# Patient Record
Sex: Female | Born: 1960 | Race: White | Hispanic: No | Marital: Single | Smoking: Never smoker
Health system: Southern US, Community
[De-identification: ages and names within clinical notes are randomized; demographics above are authoritative.]

## PROBLEM LIST (undated history)

## (undated) DIAGNOSIS — A630 Anogenital (venereal) warts: Secondary | ICD-10-CM

## (undated) DIAGNOSIS — B009 Herpesviral infection, unspecified: Secondary | ICD-10-CM

## (undated) DIAGNOSIS — K219 Gastro-esophageal reflux disease without esophagitis: Secondary | ICD-10-CM

## (undated) DIAGNOSIS — C801 Malignant (primary) neoplasm, unspecified: Secondary | ICD-10-CM

## (undated) DIAGNOSIS — D649 Anemia, unspecified: Secondary | ICD-10-CM

## (undated) DIAGNOSIS — S39012A Strain of muscle, fascia and tendon of lower back, initial encounter: Secondary | ICD-10-CM

## (undated) DIAGNOSIS — F509 Eating disorder, unspecified: Secondary | ICD-10-CM

## (undated) DIAGNOSIS — T7840XA Allergy, unspecified, initial encounter: Secondary | ICD-10-CM

## (undated) HISTORY — PX: CYSTECTOMY: SUR359

## (undated) HISTORY — DX: Herpesviral infection, unspecified: B00.9

## (undated) HISTORY — DX: Allergy, unspecified, initial encounter: T78.40XA

## (undated) HISTORY — DX: Anogenital (venereal) warts: A63.0

## (undated) HISTORY — DX: Anemia, unspecified: D64.9

## (undated) HISTORY — DX: Gastro-esophageal reflux disease without esophagitis: K21.9

## (undated) HISTORY — DX: Eating disorder, unspecified: F50.9

## (undated) HISTORY — DX: Strain of muscle, fascia and tendon of lower back, initial encounter: S39.012A

## (undated) HISTORY — DX: Malignant (primary) neoplasm, unspecified: C80.1

---
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
## 2008-06-30 ENCOUNTER — Ambulatory Visit: Payer: Self-pay | Admitting: Gastroenterology

## 2012-01-03 LAB — HM MAMMOGRAPHY: HM MAMMO: NORMAL

## 2013-06-23 LAB — HM PAP SMEAR: HM PAP: NORMAL

## 2014-06-04 DIAGNOSIS — S39012A Strain of muscle, fascia and tendon of lower back, initial encounter: Secondary | ICD-10-CM

## 2014-06-04 HISTORY — DX: Strain of muscle, fascia and tendon of lower back, initial encounter: S39.012A

## 2015-06-24 ENCOUNTER — Ambulatory Visit (INDEPENDENT_AMBULATORY_CARE_PROVIDER_SITE_OTHER): Payer: BC Managed Care – PPO | Admitting: Family Medicine

## 2015-06-24 ENCOUNTER — Encounter: Payer: Self-pay | Admitting: Family Medicine

## 2015-06-24 VITALS — BP 104/72 | HR 61 | Temp 98.6°F | Resp 16 | Ht 65.5 in | Wt 131.6 lb

## 2015-06-24 DIAGNOSIS — K219 Gastro-esophageal reflux disease without esophagitis: Secondary | ICD-10-CM

## 2015-06-24 DIAGNOSIS — Z1239 Encounter for other screening for malignant neoplasm of breast: Secondary | ICD-10-CM

## 2015-06-24 DIAGNOSIS — J302 Other seasonal allergic rhinitis: Secondary | ICD-10-CM | POA: Diagnosis not present

## 2015-06-24 DIAGNOSIS — K625 Hemorrhage of anus and rectum: Secondary | ICD-10-CM | POA: Diagnosis not present

## 2015-06-24 DIAGNOSIS — E785 Hyperlipidemia, unspecified: Secondary | ICD-10-CM

## 2015-06-24 MED ORDER — FLUTICASONE PROPIONATE 50 MCG/ACT NA SUSP: 2.0000 | Freq: Every day | NASAL | Status: AC

## 2015-06-24 MED ORDER — LORATADINE 10 MG PO TABS: 10.0000 mg | ORAL_TABLET | Freq: Every day | ORAL | Status: AC

## 2015-06-24 MED ORDER — OMEPRAZOLE 20 MG PO CPDR: 20.0000 mg | DELAYED_RELEASE_CAPSULE | Freq: Every day | ORAL | Status: AC

## 2015-06-24 NOTE — Assessment & Plan Note (Addendum)
Trial of PPI for voice hoarseness. Previous cough and SOB symptoms could be bronchospasm from GERD.  Check CMET, CBC RTC 1 mos.

## 2015-06-24 NOTE — Assessment & Plan Note (Signed)
Treat for allergic rhinitis. Flonase and claritin daily.  Consider ENT or allergy referral for testing. 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 Awaiting results.

## 2015-06-24 NOTE — Patient Instructions (Signed)
Let's try a medication for acid reflux and medications for allergies to help with your symptoms. Try flonase once daily to help with allergy symptoms. Also try claritin. We will try an omeprazole daily to see if acid reflux is causing your hoarseness.   We will also check some labs.

## 2015-06-24 NOTE — Progress Notes (Signed)
Subjective:    Patient ID: Jeanette Tanner, female    DOB: 12-04-1960, 55 y.o.   MRN: [id number]  HPI: Daarina Thigpen is a 55 y.o. female presenting on 06/24/2015 for Establish Care   HPI  Pt presents to establish care today. Previous care provider was Jefm Bryant- [facility]  It has been 1  years since Her last PCP visit. Records from previous provider will be requested and reviewed. Current medical problems include:  Genital herpes: Takes acyclovir as needed. OUtbreaks on back.  Allergies: Seasonal. Takes PRN. Iron deficiency anemia: In past. Eats meat.  Previous basal cell carinoma- does not follow-up with dermatology. Irritated throat- voice is hoarse- cleaned air units at school. Has history of allergies.  Stomach pain: Gassy stomach pain. Had rectal bleeding last weekend. Unsure if it was vaginal or rectal bleeding. Thought it was both.    Post menopausal- still has uterus. Was recently on HRT. Thinks it irritates her rectal bleeding.  Worried about cough- had previous bout of pneumonia in November, bad URI over christmas.   Health maintenance:  Last colonoscopy age 27- had bad hemorrhoids. Would like to see [facility].  Pap- 2 years ago. Mammogram- 2013.   Past Medical History  Diagnosis Date  . Allergy   . Anemia   . Eating disorder   . GERD (gastroesophageal reflux disease)   . Genital warts   . Cancer ([place])     In past skin  . 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    Social History   Social History  . Marital Status: Single    Spouse Name: N/A  . Number of Children: N/A  . Years of Education: N/A   Occupational History  . Not on file.   Social History Main Topics  . Smoking status: Never Smoker   . Smokeless tobacco: Not on file  . Alcohol Use: Yes     Comment: once a week  . Drug Use: No  . Sexual Activity: Not on file   Other Topics Concern  . Not on file   Social History Narrative  . No narrative on file   Family  History  Problem Relation Age of Onset  . Alcohol abuse Mother   . Heart disease Father    No current outpatient prescriptions on file prior to visit.   No current facility-administered medications on file prior to visit.    Review of Systems  Constitutional: Positive for fatigue. Negative for fever and chills.  HENT: Positive for postnasal drip, rhinorrhea, sore throat and voice change (hoarseness.).   Eyes: Positive for itching.  Respiratory: Positive for cough. Negative for chest tightness and wheezing.   Cardiovascular: Negative for chest pain and leg swelling.  Gastrointestinal: Positive for abdominal pain (acid reflux) and anal bleeding. Negative for nausea, vomiting, diarrhea and constipation.  Endocrine: Negative.  Negative for cold intolerance, heat intolerance, polydipsia, polyphagia and polyuria.  Genitourinary: Negative for dysuria and difficulty urinating.  Musculoskeletal: Negative.   Neurological: Negative for dizziness, light-headedness and numbness.  Psychiatric/Behavioral: Negative.    Per HPI unless specifically indicated above     Objective:    BP 104/72 mmHg  Pulse 61  Temp(Src) 98.6 F (37 C) (Oral)  Resp 16  Ht 5' 5.5" (1.664 m)  Wt 131 lb 9.6 oz (59.693 kg)  BMI 21.56 kg/m2  Wt Readings from Last 3 Encounters:  06/24/15 131 lb 9.6 oz (59.693 kg)    Physical Exam  Constitutional: She  is oriented to person, place, and time. She appears well-developed and well-nourished.  HENT:  Head: Normocephalic and atraumatic.  Nose: No mucosal edema or rhinorrhea. Right sinus exhibits no maxillary sinus tenderness and no frontal sinus tenderness. Left sinus exhibits no maxillary sinus tenderness and no frontal sinus tenderness.  Post nasal drip seen.    Eyes: EOM are normal. Pupils are equal, round, and reactive to light. Lids are everted and swept, no foreign bodies found. Right conjunctiva is injected. Right conjunctiva has no hemorrhage. Left conjunctiva is  injected. Left conjunctiva has no hemorrhage.  Neck: Neck supple.  Cardiovascular: Normal rate, regular rhythm and normal heart sounds.  Exam reveals no gallop and no friction rub.   No murmur heard. Pulmonary/Chest: Effort normal and breath sounds normal. She has no wheezes. She exhibits no tenderness.  Abdominal: Soft. Normal appearance and bowel sounds are normal. She exhibits no distension and no mass. There is no tenderness. There is no rebound and no guarding.  Musculoskeletal: Normal range of motion. She exhibits no edema or tenderness.  Lymphadenopathy:    She has no cervical adenopathy.  Neurological: She is alert and oriented to person, place, and time.  Skin: Skin is warm and dry.   Results for orders placed or performed in visit on 06/24/15  HM MAMMOGRAPHY  Result Value Ref Range   HM Mammogram normal   HM PAP SMEAR  Result Value Ref Range   HM Pap smear normal       Assessment & Plan:   Problem List Items Addressed This Visit      Respiratory   Other seasonal allergic rhinitis    Treat for allergic rhinitis. Flonase and claritin daily.  Consider ENT or allergy referral for testing. 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 Awaiting results.        Relevant Medications   fluticasone (FLONASE) 50 MCG/ACT nasal spray   loratadine (CLARITIN) 10 MG tablet   Other Relevant Orders   IgE     Digestive   Esophageal reflux    Trial of PPI for voice hoarseness. Previous cough and SOB symptoms could be bronchospasm from GERD.  Check CMET, CBC RTC 1 mos.       Relevant Medications   omeprazole (PRILOSEC) 20 MG capsule   Other Relevant Orders   CBC with Differential    Other Visit Diagnoses    Rectal bleeding    -  Primary    Likely hemmorhoid but refer to GI for evaluation and probable colonscopy. Have advised pt to stop hormones until we work-up the bleeding since they seem to exac    Relevant Orders    CBC with  Differential    Comprehensive Metabolic Panel (CMET)    Ambulatory referral to Gastroenterology    Mild hyperlipidemia        Relevant Orders    Lipid Profile    Breast cancer screening        Relevant Orders    MM Digital Screening       Meds ordered this encounter  Medications  . acyclovir (ZOVIRAX) 800 MG tablet    Sig: Take 800 mg by mouth 2 (two) times daily. 1 two times daily for 5 days when needed  . fluticasone (FLONASE) 50 MCG/ACT nasal spray    Sig: Place 2 sprays into both nostrils daily.    Dispense:  16 g    Refill:  11    Order Specific  Question:  Supervising Provider    Answer:  Arlis Porta [phone]  . loratadine (CLARITIN) 10 MG tablet    Sig: Take 1 tablet (10 mg total) by mouth daily.    Dispense:  30 tablet    Refill:  11    Order Specific Question:  Supervising Provider    Answer:  Arlis Porta [phone]  . omeprazole (PRILOSEC) 20 MG capsule    Sig: Take 1 capsule (20 mg total) by mouth daily.    Dispense:  30 capsule    Refill:  3    Order Specific Question:  Supervising Provider    Answer:  Arlis Porta [phone]      Follow up plan: Return in about 4 weeks (around 07/22/2015) for GERD, hoarseness.Marland Kitchen

## 2015-07-22 ENCOUNTER — Ambulatory Visit: Payer: BC Managed Care – PPO | Admitting: Family Medicine

## 2015-12-08 ENCOUNTER — Emergency Department: Payer: BC Managed Care – PPO

## 2015-12-08 ENCOUNTER — Emergency Department
Admission: EM | Admit: 2015-12-08 | Discharge: 2015-12-08 | Disposition: A | Discharge status: Home / Self Care | Payer: BC Managed Care – PPO | Attending: Emergency Medicine | Admitting: Emergency Medicine

## 2015-12-08 ENCOUNTER — Encounter: Payer: Self-pay | Admitting: Emergency Medicine

## 2015-12-08 DIAGNOSIS — Z85828 Personal history of other malignant neoplasm of skin: Secondary | ICD-10-CM | POA: Insufficient documentation

## 2015-12-08 DIAGNOSIS — M5441 Lumbago with sciatica, right side: Secondary | ICD-10-CM | POA: Diagnosis not present

## 2015-12-08 DIAGNOSIS — Z7951 Long term (current) use of inhaled steroids: Secondary | ICD-10-CM | POA: Insufficient documentation

## 2015-12-08 DIAGNOSIS — M545 Low back pain: Secondary | ICD-10-CM | POA: Diagnosis present

## 2015-12-08 DIAGNOSIS — Z91018 Allergy to other foods: Secondary | ICD-10-CM | POA: Diagnosis not present

## 2015-12-08 DIAGNOSIS — Z79899 Other long term (current) drug therapy: Secondary | ICD-10-CM | POA: Insufficient documentation

## 2015-12-08 DIAGNOSIS — M79661 Pain in right lower leg: Secondary | ICD-10-CM | POA: Diagnosis not present

## 2015-12-08 DIAGNOSIS — Z8719 Personal history of other diseases of the digestive system: Secondary | ICD-10-CM | POA: Insufficient documentation

## 2015-12-08 DIAGNOSIS — R110 Nausea: Secondary | ICD-10-CM | POA: Diagnosis not present

## 2015-12-08 LAB — COMPREHENSIVE METABOLIC PANEL
ALBUMIN: 4.3 g/dL (ref 3.5–5.0)
ALK PHOS: 67 U/L (ref 38–126)
ALT: 15 U/L (ref 14–54)
ANION GAP: 10 (ref 5–15)
AST: 19 U/L (ref 15–41)
BILIRUBIN TOTAL: 0.6 mg/dL (ref 0.3–1.2)
BUN: 15 mg/dL (ref 6–20)
CALCIUM: 8.9 mg/dL (ref 8.9–10.3)
CO2: 23 mmol/L (ref 22–32)
Chloride: 95 mmol/L — ABNORMAL LOW (ref 101–111)
Creatinine, Ser: 0.85 mg/dL (ref 0.44–1.00)
GLUCOSE: 104 mg/dL — AB (ref 65–99)
Potassium: 4.2 mmol/L (ref 3.5–5.1)
Sodium: 128 mmol/L — ABNORMAL LOW (ref 135–145)
TOTAL PROTEIN: 6.5 g/dL (ref 6.5–8.1)

## 2015-12-08 LAB — CBC
HEMATOCRIT: 38.5 % (ref 35.0–47.0)
HEMOGLOBIN: 12.9 g/dL (ref 12.0–16.0)
MCH: 28.9 pg (ref 26.0–34.0)
MCHC: 33.5 g/dL (ref 32.0–36.0)
MCV: 86.2 fL (ref 80.0–100.0)
Platelets: 226 10*3/uL (ref 150–440)
RBC: 4.46 MIL/uL (ref 3.80–5.20)
RDW: 13.3 % (ref 11.5–14.5)
WBC: 13.2 10*3/uL — ABNORMAL HIGH (ref 3.6–11.0)

## 2015-12-08 MED ORDER — ONDANSETRON 4 MG PO TBDP
ORAL_TABLET | ORAL | Status: AC
  Administered 2015-12-08: 4 mg via ORAL
  Filled 2015-12-08: qty 1

## 2015-12-08 MED ORDER — OXYCODONE-ACETAMINOPHEN 5-325 MG PO TABS
2.0000 | ORAL_TABLET | Freq: Once | ORAL | Status: AC
  Administered 2015-12-08: 2 via ORAL
  Filled 2015-12-08: qty 2

## 2015-12-08 MED ORDER — MORPHINE SULFATE (PF) 4 MG/ML IV SOLN
4.0000 mg | Freq: Once | INTRAVENOUS | Status: AC
  Administered 2015-12-08: 4 mg via INTRAVENOUS
  Filled 2015-12-08: qty 1

## 2015-12-08 MED ORDER — DEXAMETHASONE SODIUM PHOSPHATE 10 MG/ML IJ SOLN
10.0000 mg | Freq: Once | INTRAMUSCULAR | Status: AC
  Administered 2015-12-08: 10 mg via INTRAVENOUS
  Filled 2015-12-08: qty 1

## 2015-12-08 MED ORDER — ONDANSETRON HCL 4 MG/2ML IJ SOLN
4.0000 mg | Freq: Once | INTRAMUSCULAR | Status: AC
  Administered 2015-12-08: 4 mg via INTRAVENOUS
  Filled 2015-12-08: qty 2

## 2015-12-08 MED ORDER — OXYCODONE-ACETAMINOPHEN 5-325 MG PO TABS: 1.0000 | ORAL_TABLET | Freq: Four times a day (QID) | ORAL | Status: AC | PRN

## 2015-12-08 MED ORDER — ONDANSETRON 4 MG PO TBDP
4.0000 mg | ORAL_TABLET | Freq: Once | ORAL | Status: AC
  Administered 2015-12-08: 4 mg via ORAL

## 2015-12-08 MED ORDER — SODIUM CHLORIDE 0.9 % IV BOLUS (SEPSIS)
1000.0000 mL | Freq: Once | INTRAVENOUS | Status: AC
  Administered 2015-12-08: 1000 mL via INTRAVENOUS

## 2015-12-08 MED ORDER — ONDANSETRON 4 MG PO TBDP: 4.0000 mg | ORAL_TABLET | Freq: Three times a day (TID) | ORAL | Status: AC | PRN

## 2015-12-08 NOTE — ED Provider Notes (Signed)
[facility] Emergency Department Provider Note  Time seen: 5:54 PM  I have reviewed the triage vital signs and the nursing notes.   HISTORY  Chief Complaint Leg Pain; Pain; and Nausea    HPI Jeanette Tanner is a 55 y.o. female with a past medical history of gastric reflux, back pain, who presents the emergency department with 2 days of acute on chronic back pain. According to the patient for the past 2 days she had excruciating back pain.Patient is seen by Dr. Rudene Christians for her back pain, but he cannot see them yesterday. They went to [facility] emergency Department yesterday and were treated with a short course of Percocet and Valium and ibuprofen. Patient states her pain is the same or worse today. States she is having trouble moving and she does not have an orthopedic appointment until tomorrow. She is asking frequently keep her in the hospital until her orthopedic appointment tomorrow. Patient reports chronic back pain due to an injury 2 years ago. Denies any inciting event this time. Denies any recent heavy lifting or traumatic event. Patient states the pain radiates down her right leg, but denies any numbness, tingling or weakness of the right leg. Patient is able to move the leg now but states she took a Valium and Percocet this morning. Patient states she still is having trouble ambulating. She says they came to the emergency department today because the back pain has not improved and she will not have a ride to her orthopedic appointment tomorrow as her son has to go back to work.     Past Medical History  Diagnosis Date  . Allergy   . Anemia   . Eating disorder   . GERD (gastroesophageal reflux disease)   . Genital warts   . Cancer ([place])     In past skin  . 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   Marland Kitchen Herpes simplex type 2 infection     Patient Active Problem List   Diagnosis Date Noted  . Esophageal reflux 06/24/2015  . Other seasonal  allergic rhinitis 06/24/2015    Past Surgical History  Procedure Laterality Date  . Appendectomy  1969  . Cystectomy      Ganglion cyst removed from shoulder    Current Outpatient Rx  Name  Route  Sig  Dispense  Refill  . acyclovir (ZOVIRAX) 800 MG tablet   Oral   Take 800 mg by mouth 2 (two) times daily. 1 two times daily for 5 days when needed         . fluticasone (FLONASE) 50 MCG/ACT nasal spray   Each Nare   Place 2 sprays into both nostrils daily.   16 g   11   . loratadine (CLARITIN) 10 MG tablet   Oral   Take 1 tablet (10 mg total) by mouth daily.   30 tablet   11   . omeprazole (PRILOSEC) 20 MG capsule   Oral   Take 1 capsule (20 mg total) by mouth daily.   30 capsule   3     Allergies Soy allergy and Sulfa antibiotics  Family History  Problem Relation Age of Onset  . Alcohol abuse Mother   . Heart disease Father     Social History Social History  Substance Use Topics  . Smoking status: Never Smoker   . Smokeless tobacco: None  . Alcohol Use: Yes     Comment: once a week  Review of Systems Constitutional: Negative for fever. Cardiovascular: Negative for chest pain. Respiratory: Negative for shortness of breath. Gastrointestinal: Negative for abdominal pain Musculoskeletal: Positive for lower back pain. Positive for right leg pain. Neurological: Negative for headache 10-point ROS otherwise negative.  ____________________________________________   PHYSICAL EXAM:  VITAL SIGNS: ED Triage Vitals  Enc Vitals Group     BP 12/08/15 1459 112/62 mmHg     Pulse Rate 12/08/15 1459 65     Resp 12/08/15 1459 18     Temp 12/08/15 1459 98.4 F (36.9 C)     Temp Source 12/08/15 1459 Oral     SpO2 12/08/15 1459 100 %     Weight 12/08/15 1459 125 lb (56.7 kg)     Height 12/08/15 1459 5' 5.5" (1.664 m)     Head Cir --      Peak Flow --      Pain Score 12/08/15 1501 10     Pain Loc --      Pain Edu? --      Excl. in A[place]? --      Constitutional: Alert and oriented. Well appearing and in no distress. Eyes: Normal exam ENT   Head: Normocephalic and atraumatic.   Mouth/Throat: Mucous membranes are moist. Cardiovascular: Normal rate, regular rhythm. No murmur Respiratory: Normal respiratory effort without tachypnea nor retractions. Breath sounds are clear  Gastrointestinal: Soft and nontender. No distention.   Musculoskeletal: Mild right-sided lumbar paraspinal tenderness to palpation No lower extremity tenderness or edema. 2+ DP pulse. Sensation intact and equal. Neurologic:  Normal speech and language. No gross focal neurologic deficits Skin:  Skin is warm, dry and intact.  Psychiatric: Mood and affect are normal.   ____________________________________________   RADIOLOGY  Ultrasound negative for DVT  ____________________________________________   INITIAL IMPRESSION / ASSESSMENT AND PLAN / ED COURSE  Pertinent labs & imaging results that were available during my care of the patient were reviewed by me and considered in my medical decision making (see chart for details).  Patient presents the emergency department with lower back pain and right leg pain for the past 2 days. States she sees orthopedics for back pain but they could not see her until tomorrow. Patient denies any acute injury. States for the past 2 years she is having intermittent back pain, but this is very severe. States she was worried that she would not be able to go to her appointment tomorrow because her son needs to work so they came to the emergency department today. Patient states she was in severe pain this morning but took Percocet and Valium and now feels somewhat better. The patient is able to range the leg and hip during our examination. Nontender to palpation. 2+ DP pulse, no edema. Patient was seen at [facility] yesterday had a lumbar x-ray performed which was read as normal. We will check labs and perform a right lower  extremity ultrasound to rule out DVT. Overall the patient appears well, moderate right-sided paraspinal lumbar and is to palpation with minimal midline tenderness.  Ultrasound negative for DVT. Patient's labs have resulted showing hyponatremia with a sodium of 128. In reviewing the patient's old laboratory values her sodium and 2015 was 132. The patient has received a liter of normal saline in the emergency department. I discussed this with the patient the need to follow up with her primary care doctor. Patient has a follow-up appointment with orthopedics tomorrow for her back pain. We'll discharge with Percocet. Patient is agreeable to this  plan. I discussed return precautions for increased pain, numbness or weakness of the leg, or incontinence.  ____________________________________________   FINAL CLINICAL IMPRESSION(S) / ED DIAGNOSES  Lower back pain right leg.  Harvest Dark, MD 12/08/15 2123

## 2015-12-08 NOTE — ED Notes (Signed)
Patient presents to the ED with right leg pain x 2 days.  Patient denies any injury to her leg.  Patient reports being seen at [facility] for leg pain yesterday and was given percocet.  Since taking the percocet patient reports some nausea and occasional abdominal cramping.  Patient states severe pain to her leg is intermittent but some pain has been constant.  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  Patient states pain has eased some from the percocet.

## 2015-12-08 NOTE — Discharge Instructions (Signed)
Please take your pain and nausea medication as needed, as prescribed. Please follow-up with orthopedics tomorrow as scheduled. Return to the emergency department for any worsening pain, numbness, weakness or urinary/bowel incontinence.   Sciatica Sciatica is pain, weakness, numbness, or tingling along the path of the sciatic nerve. The nerve starts in the lower back and runs down the back of each leg. The nerve controls the muscles in the lower leg and in the back of the knee, while also providing sensation to the back of the thigh, lower leg, and the sole of your foot. Sciatica is a symptom of another medical condition. For instance, nerve damage or certain conditions, such as a herniated disk or bone spur on the spine, pinch or put pressure on the sciatic nerve. This causes the pain, weakness, or other sensations normally associated with sciatica. Generally, sciatica only affects one side of the body. CAUSES   Herniated or slipped disc.  Degenerative disk disease.  A pain disorder involving the narrow muscle in the buttocks (piriformis syndrome).  Pelvic injury or fracture.  Pregnancy.  Tumor (rare). SYMPTOMS  Symptoms can vary from mild to very severe. The symptoms usually travel from the low back to the buttocks and down the back of the leg. Symptoms can include:  Mild tingling or dull aches in the lower back, leg, or hip.  Numbness in the back of the calf or sole of the foot.  Burning sensations in the lower back, leg, or hip.  Sharp pains in the lower back, leg, or hip.  Leg weakness.  Severe back pain inhibiting movement. These symptoms may get worse with coughing, sneezing, laughing, or prolonged sitting or standing. Also, being overweight may worsen symptoms. DIAGNOSIS  Your caregiver will perform a physical exam to look for common symptoms of sciatica. He or she may ask you to do certain movements or activities that would trigger sciatic nerve pain. Other tests may be  performed to find the cause of the sciatica. These may include:  Blood tests.  X-rays.  Imaging tests, such as an MRI or CT scan. TREATMENT  Treatment is directed at the cause of the sciatic pain. Sometimes, treatment is not necessary and the pain and discomfort goes away on its own. If treatment is needed, your caregiver may suggest:  Over-the-counter medicines to relieve pain.  Prescription medicines, such as anti-inflammatory medicine, muscle relaxants, or narcotics.  Applying heat or ice to the painful area.  Steroid injections to lessen pain, irritation, and inflammation around the nerve.  Reducing activity during periods of pain.  Exercising and stretching to strengthen your abdomen and improve flexibility of your spine. Your caregiver may suggest losing weight if the extra weight makes the back pain worse.  Physical therapy.  Surgery to eliminate what is pressing or pinching the nerve, such as a bone spur or part of a herniated disk. HOME CARE INSTRUCTIONS   Only take over-the-counter or prescription medicines for pain or discomfort as directed by your caregiver.  Apply ice to the affected area for 20 minutes, 3-4 times a day for the first 48-72 hours. Then try heat in the same way.  Exercise, stretch, or perform your usual activities if these do not aggravate your pain.  Attend physical therapy sessions as directed by your caregiver.  Keep all follow-up appointments as directed by your caregiver.  Do not wear high heels or shoes that do not provide proper support.  Check your mattress to see if it is too soft. A firm  mattress may lessen your pain and discomfort. SEEK IMMEDIATE MEDICAL CARE IF:   You lose control of your bowel or bladder (incontinence).  You have increasing weakness in the lower back, pelvis, buttocks, or legs.  You have redness or swelling of your back.  You have a burning sensation when you urinate.  You have pain that gets worse when you  lie down or awakens you at night.  Your pain is worse than you have experienced in the past.  Your pain is lasting longer than 4 weeks.  You are suddenly losing weight without reason. MAKE SURE YOU:  Understand these instructions.  Will watch your condition.  Will get help right away if you are not doing well or get worse.   This information is not intended to replace advice given to you by your health care provider. Make sure you discuss any questions you have with your health care provider.   Document Released: 05/15/2001 Document Revised: 02/09/2015 Document Reviewed: 09/30/2011 Elsevier Interactive Patient Education [employer].

## 2015-12-09 ENCOUNTER — Telehealth: Payer: Self-pay | Admitting: Emergency Medicine

## 2015-12-09 NOTE — ED Notes (Signed)
Patient called and had lengthy conversation with this RN about discharge plan of care. She was unaware that we were "not the same" as [facility]. She was provided with a work note at the time of discharge that said that she could return to work on 12/12/15. 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 How am I supposed to go back to work when I cant even walk"? RN explained to patient that 2-3 days off is the maximum time that could be given off through the ED without it falling under FMLA; explained FMLA procedures to patient. Patient continues to be upset - "How am I going to get that if Dr. Eddie Candle talk to me"? RN explained to patient that Dr. Rudene Christians would see here in follow up and reminded her that she was only discharged last night around 2200. Patient also asking for her son to be given a note for work; advised that Dr. Kerman Passey told her that he would give him one, but did not. Patient asking that not state "Cornie is not able to walk or drive and will need to rely on her son to help her get to and from her doctor's appointments. Dyonne is taking Percocet, Valium, and Adderall, which are all medications that she cannot drive with. Elan needs to be off from work to take care of his mother". RN explained to patient that all of those details could and would not be included on the documentation for her son. She became upset and inquired as to why. RN explained that due to HIPAA privacy regulations, this information could not be shared with her son's job. RN advised patient once again that it could not be done. Patient crying throughout the conversation. She reports that her son has already left for work. Indicates that he will either leave at lunch for the appointment and risk getting fired, or she will have to miss her appointment. RN advised that he was willing to provide documentation of her son's time spent in the ED; patient/son will need to pick up. Patient begins crying once again citing  that she has no way of getting the note; son already at work. In attempts to accommodate patient and her request, RN offered to fax note to her son's job; number provided and this RN asked to fax note to the attention of "Jenny-manager". Note sent at 0630 that stated that patient was seen here from 1458 until 2200 on 12/08/15 - note asked that Kyle Laack be excused from work if possible to take patient to her MD appointment. No specific details were shared regarding patient's visit to the ED. Note indicated that further information will need to be obtained from the employee and/or the patient due to privacy regulations. Patient appreciative of efforts and thanked this RN for time and attention to this matter.

## 2015-12-14 ENCOUNTER — Ambulatory Visit
Admission: RE | Admit: 2015-12-14 | Discharge: 2015-12-14 | Disposition: A | Discharge status: Home / Self Care | Payer: BC Managed Care – PPO | Source: Ambulatory Visit | Attending: Physician Assistant | Admitting: Physician Assistant

## 2015-12-14 ENCOUNTER — Other Ambulatory Visit: Payer: Self-pay | Admitting: Physician Assistant

## 2015-12-14 DIAGNOSIS — R1031 Right lower quadrant pain: Secondary | ICD-10-CM | POA: Diagnosis present

## 2015-12-14 DIAGNOSIS — Z9089 Acquired absence of other organs: Secondary | ICD-10-CM | POA: Diagnosis not present

## 2015-12-14 DIAGNOSIS — N200 Calculus of kidney: Secondary | ICD-10-CM | POA: Insufficient documentation

## 2015-12-14 DIAGNOSIS — D72829 Elevated white blood cell count, unspecified: Secondary | ICD-10-CM

## 2015-12-14 DIAGNOSIS — R112 Nausea with vomiting, unspecified: Secondary | ICD-10-CM

## 2015-12-14 DIAGNOSIS — R634 Abnormal weight loss: Secondary | ICD-10-CM

## 2015-12-14 MED ORDER — IOPAMIDOL (ISOVUE-300) INJECTION 61%
100.0000 mL | Freq: Once | INTRAVENOUS | Status: AC | PRN
  Administered 2015-12-14: 100 mL via INTRAVENOUS

## 2016-01-03 ENCOUNTER — Emergency Department
Admission: EM | Admit: 2016-01-03 | Discharge: 2016-01-03 | Disposition: A | Discharge status: Home / Self Care | Payer: BC Managed Care – PPO | Attending: Student | Admitting: Student

## 2016-01-03 DIAGNOSIS — Z23 Encounter for immunization: Secondary | ICD-10-CM | POA: Diagnosis not present

## 2016-01-03 DIAGNOSIS — S61250A Open bite of right index finger without damage to nail, initial encounter: Secondary | ICD-10-CM | POA: Insufficient documentation

## 2016-01-03 DIAGNOSIS — Z85828 Personal history of other malignant neoplasm of skin: Secondary | ICD-10-CM | POA: Insufficient documentation

## 2016-01-03 DIAGNOSIS — Y999 Unspecified external cause status: Secondary | ICD-10-CM | POA: Diagnosis not present

## 2016-01-03 DIAGNOSIS — Y929 Unspecified place or not applicable: Secondary | ICD-10-CM | POA: Diagnosis not present

## 2016-01-03 DIAGNOSIS — Y939 Activity, unspecified: Secondary | ICD-10-CM | POA: Diagnosis not present

## 2016-01-03 DIAGNOSIS — Z203 Contact with and (suspected) exposure to rabies: Secondary | ICD-10-CM

## 2016-01-03 DIAGNOSIS — W5501XA Bitten by cat, initial encounter: Secondary | ICD-10-CM | POA: Insufficient documentation

## 2016-01-03 DIAGNOSIS — S61258A Open bite of other finger without damage to nail, initial encounter: Secondary | ICD-10-CM

## 2016-01-03 MED ORDER — AMOXICILLIN-POT CLAVULANATE 875-125 MG PO TABS: 1.0000 | ORAL_TABLET | Freq: Two times a day (BID) | ORAL | 0 refills | Status: AC

## 2016-01-03 MED ORDER — RABIES IMMUNE GLOBULIN 150 UNIT/ML IM INJ
20.0000 [IU]/kg | INJECTION | Freq: Once | INTRAMUSCULAR | Status: AC
  Administered 2016-01-03: 1125 [IU] via INTRAMUSCULAR
  Filled 2016-01-03: qty 8

## 2016-01-03 MED ORDER — RABIES VACCINE, PCEC IM SUSR
1.0000 mL | Freq: Once | INTRAMUSCULAR | Status: AC
  Administered 2016-01-03: 1 mL via INTRAMUSCULAR
  Filled 2016-01-03: qty 1

## 2016-01-03 NOTE — ED Triage Notes (Signed)
Pts reports to ED w/ c/o cat bite to R index finger.  Pt sts she does not know cat, cat is not known.  Denies CP, SOB, n/v/d, or LOC.  NAD.  Sensation/circulation intact to finger

## 2016-01-03 NOTE — ED Provider Notes (Signed)
[facility] Emergency Department Provider Note  ____________________________________________  Time seen: Approximately 4:26 PM  I have reviewed the triage vital signs and the nursing notes.   HISTORY  Chief Complaint Finger Injury   HPI Jeanette Tanner is a 55 y.o. female who presents to the emergency department for evaluation of a cat bite to the right index finger. Cat is not known. Incident occurred last night. She states that she saw a kitten and walked over to it, didn't down and started to reach out for it and it hissed and bit her hand.  Past Medical History:  Diagnosis Date  . Allergy   . Anemia   . Cancer ([place])    In past skin  . Eating disorder   . Genital warts   . GERD (gastroesophageal reflux disease)   . Herpes simplex type 2 infection   . 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     Patient Active Problem List   Diagnosis Date Noted  . Esophageal reflux 06/24/2015  . Other seasonal allergic rhinitis 06/24/2015    Past Surgical History:  Procedure Laterality Date  . APPENDECTOMY  1969  . CYSTECTOMY     Ganglion cyst removed from shoulder    Prior to Admission medications   Medication Sig Start Date End Date Taking? Authorizing Provider  acyclovir (ZOVIRAX) 800 MG tablet Take 800 mg by mouth 2 (two) times daily. 1 two times daily for 5 days when needed    Historical Provider, MD  amoxicillin-clavulanate (AUGMENTIN) 875-125 MG tablet Take 1 tablet by mouth 2 (two) times daily. 01/03/16   Victorino Dike, FNP  fluticasone (FLONASE) 50 MCG/ACT nasal spray Place 2 sprays into both nostrils daily. 06/24/15   Amy Overton Mam, NP  loratadine (CLARITIN) 10 MG tablet Take 1 tablet (10 mg total) by mouth daily. 06/24/15   Amy Overton Mam, NP  omeprazole (PRILOSEC) 20 MG capsule Take 1 capsule (20 mg total) by mouth daily. 06/24/15   Amy Lauren Krebs, NP  ondansetron (ZOFRAN ODT) 4 MG disintegrating tablet Take 1 tablet (4  mg total) by mouth every 8 (eight) hours as needed for nausea or vomiting. 12/08/15   Harvest Dark, MD  oxyCODONE-acetaminophen (ROXICET) 5-325 MG tablet Take 1 tablet by mouth every 6 (six) hours as needed. 12/08/15 12/07/16  Harvest Dark, MD    Allergies Soy allergy and Sulfa antibiotics  Family History  Problem Relation Age of Onset  . Alcohol abuse Mother   . Heart disease Father     Social History Social History  Substance Use Topics  . Smoking status: Never Smoker  . Smokeless tobacco: Not on file  . Alcohol use Yes     Comment: once a week    Review of Systems  Constitutional: Negative for fever/chills Respiratory: Negative for shortness of breath. Musculoskeletal: Negative for pain. Skin: Positive for wound Neurological: Negative for headaches, focal weakness or numbness. ____________________________________________   PHYSICAL EXAM:  VITAL SIGNS: ED Triage Vitals [01/03/16 1615]  Enc Vitals Group     BP 111/64     Pulse Rate (!) 58     Resp 16     Temp 97.8 F (36.6 C)     Temp Source Oral     SpO2 100 %     Weight 122 lb (55.3 kg)     Height 5\' 5"  (1.651 m)     Head Circumference      Peak Flow  Pain Score 3     Pain Loc      Pain Edu?      Excl. in [place]?      Constitutional: Alert and oriented. Well appearing and in no acute distress. Eyes: Conjunctivae are normal. EOMI. Nose: No congestion/rhinnorhea. Mouth/Throat: Mucous membranes are moist.   Neck: No stridor. Cardiovascular: Good peripheral circulation. Respiratory: Normal respiratory effort.  No retractions. Musculoskeletal: FROM throughout. Neurologic:  Normal speech and language. No gross focal neurologic deficits are appreciated. Skin: Puncture wound to the right index finger with associated erythema and mild edema  ____________________________________________   LABS (all labs ordered are listed, but only abnormal results are displayed)  Labs Reviewed - No data to  display ____________________________________________  EKG   ____________________________________________  RADIOLOGY   ____________________________________________   PROCEDURES  Procedure(s) performed: None ____________________________________________   INITIAL IMPRESSION / ASSESSMENT AND PLAN / ED COURSE  Pertinent labs & imaging results that were available during my care of the patient were reviewed by me and considered in my medical decision making (see chart for details).  She will be advised to take the Augmentin as prescribed.  She was advised to follow up with [facility] urgent care for the remainder of the injections.  She was also advised to return to the emergency department for symptoms that change or worsen if unable to schedule an appointment.  ____________________________________________   FINAL CLINICAL IMPRESSION(S) / ED DIAGNOSES  Final diagnoses:  Cat bite of index finger, initial encounter  Need for post exposure prophylaxis for rabies    Discharge Medication List as of 01/03/2016  6:16 PM    START taking these medications   Details  amoxicillin-clavulanate (AUGMENTIN) 875-125 MG tablet Take 1 tablet by mouth 2 (two) times daily., Starting Tue 01/03/2016, Print        Note:  This document was prepared using Dragon voice recognition software and may include unintentional dictation errors.    Victorino Dike, FNP 01/03/16 1859    Joanne Gavel, MD 01/04/16 [phone]

## 2016-01-03 NOTE — Discharge Instructions (Signed)
You will need a second injection on Friday, August 4 then another on August 8, and the last on August 15.

## 2016-01-06 ENCOUNTER — Ambulatory Visit
Admission: EM | Admit: 2016-01-06 | Discharge: 2016-01-06 | Disposition: A | Discharge status: Home / Self Care | Payer: BC Managed Care – PPO | Attending: Family Medicine | Admitting: Family Medicine

## 2016-01-06 ENCOUNTER — Encounter: Payer: Self-pay | Admitting: Emergency Medicine

## 2016-01-06 DIAGNOSIS — Z203 Contact with and (suspected) exposure to rabies: Secondary | ICD-10-CM

## 2016-01-06 MED ORDER — RABIES VACCINE, PCEC IM SUSR
1.0000 mL | Freq: Once | INTRAMUSCULAR | Status: AC
  Administered 2016-01-06: 1 mL via INTRAMUSCULAR

## 2016-01-06 NOTE — ED Notes (Signed)
Patient shows no signs of adverse reaction to medication at this time.  Patient was instructed to follow-up on August 8 for her Day 7 Rabies Injection.  Patient verbalized understanding.

## 2016-01-06 NOTE — ED Triage Notes (Signed)
Patient here for Day the rabies injection.  Patient denies pain at this time.  Patient denies fevers.

## 2016-01-10 ENCOUNTER — Ambulatory Visit
Admission: EM | Admit: 2016-01-10 | Discharge: 2016-01-10 | Disposition: A | Discharge status: Home / Self Care | Payer: BC Managed Care – PPO | Attending: Family Medicine | Admitting: Family Medicine

## 2016-01-10 ENCOUNTER — Encounter: Payer: Self-pay | Admitting: *Deleted

## 2016-01-10 DIAGNOSIS — Z203 Contact with and (suspected) exposure to rabies: Secondary | ICD-10-CM

## 2016-01-10 MED ORDER — RABIES VACCINE, PCEC IM SUSR
1.0000 mL | Freq: Once | INTRAMUSCULAR | Status: AC
  Administered 2016-01-10: 1 mL via INTRAMUSCULAR

## 2016-01-10 NOTE — ED Triage Notes (Signed)
Patient has arrived for rabies injection.

## 2016-01-16 ENCOUNTER — Ambulatory Visit
Admission: EM | Admit: 2016-01-16 | Discharge: 2016-01-16 | Discharge status: Absent without leave | Payer: BC Managed Care – PPO

## 2016-01-17 ENCOUNTER — Ambulatory Visit
Admission: EM | Admit: 2016-01-17 | Discharge: 2016-01-17 | Disposition: A | Discharge status: Home / Self Care | Payer: BC Managed Care – PPO | Attending: Family Medicine | Admitting: Family Medicine

## 2016-01-17 DIAGNOSIS — Z203 Contact with and (suspected) exposure to rabies: Secondary | ICD-10-CM | POA: Diagnosis not present

## 2016-01-17 MED ORDER — RABIES VACCINE, PCEC IM SUSR
1.0000 mL | Freq: Once | INTRAMUSCULAR | Status: AC
  Administered 2016-01-17: 1 mL via INTRAMUSCULAR

## 2016-01-17 NOTE — ED Notes (Signed)
Patient waited 15 minutes and no reactions were noted. Injection site is unremarkable. 

## 2016-01-17 NOTE — ED Triage Notes (Signed)
Rabavert Vaccine

## 2016-03-26 ENCOUNTER — Ambulatory Visit: Admit: 2016-03-26 | Payer: BC Managed Care – PPO | Admitting: Gastroenterology

## 2016-03-26 SURGERY — COLONOSCOPY
Anesthesia: General

## 2016-09-06 ENCOUNTER — Encounter: Admission: RE | Payer: Self-pay | Source: Ambulatory Visit

## 2016-09-06 ENCOUNTER — Ambulatory Visit
Admission: RE | Admit: 2016-09-06 | Payer: BC Managed Care – PPO | Source: Ambulatory Visit | Admitting: Gastroenterology

## 2016-09-06 SURGERY — COLONOSCOPY WITH PROPOFOL
Anesthesia: General

## 2017-03-14 ENCOUNTER — Encounter: Payer: Self-pay | Admitting: *Deleted

## 2017-03-15 ENCOUNTER — Ambulatory Visit
Admission: RE | Admit: 2017-03-15 | Payer: BC Managed Care – PPO | Source: Ambulatory Visit | Admitting: Gastroenterology

## 2017-03-15 ENCOUNTER — Encounter: Admission: RE | Payer: Self-pay | Source: Ambulatory Visit

## 2017-03-15 SURGERY — ESOPHAGOGASTRODUODENOSCOPY (EGD) WITH PROPOFOL

## 2017-06-06 IMAGING — CT CT ABD-PELV W/ CM
2 of 5 series · 16 of 46 positions shown, 18 images · IV contrast (iopamidol)
Comparison: None.

CLINICAL DATA: Right lower quadrant pain with nausea and vomiting
and leukocytosis.

EXAM:
CT ABDOMEN AND PELVIS WITH CONTRAST
TECHNIQUE: Multidetector CT imaging of the abdomen and pelvis was performed
using the standard protocol following bolus administration of
intravenous contrast.
CONTRAST:  100mL K5OJUJ-2EE IOPAMIDOL (K5OJUJ-2EE) INJECTION 61%

[Series 2: routine abd pel with · axial · 0.64mm/px · z∈[-932,-562]mm · 13 of 84 slices shown, 15 images]
[im 5/84  soft-tissue]
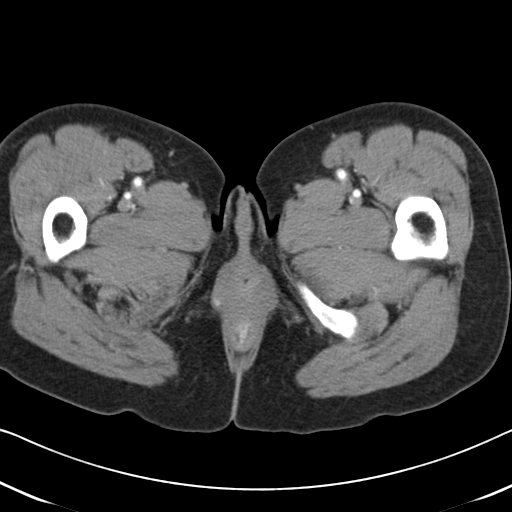
[im 5/84  bone]
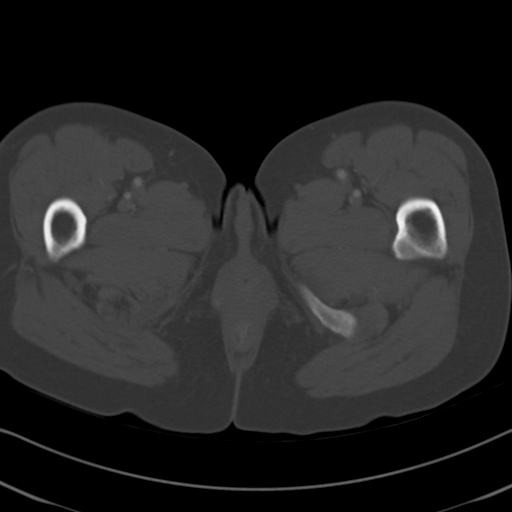
[im 13/84  soft-tissue]
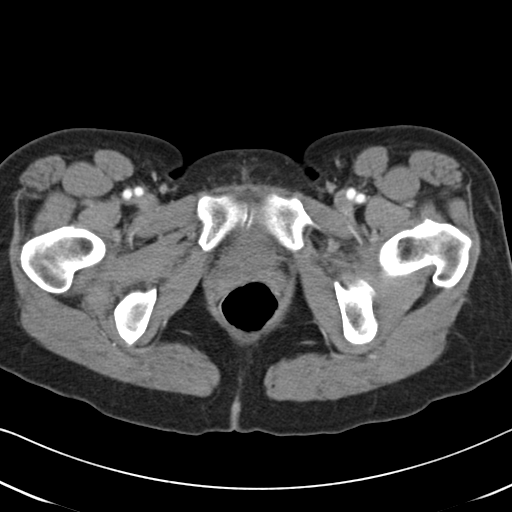
[im 17/84  soft-tissue]
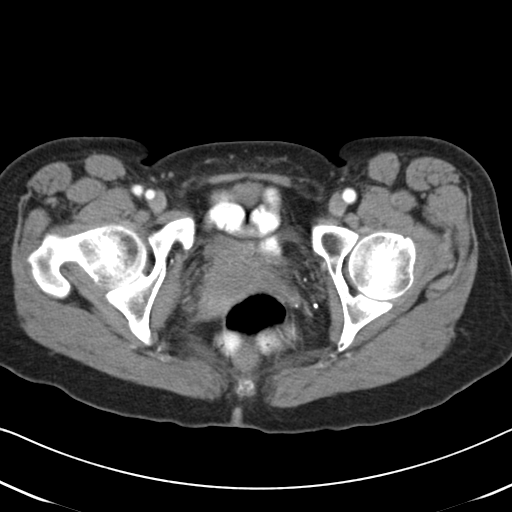
[im 25/84  soft-tissue]
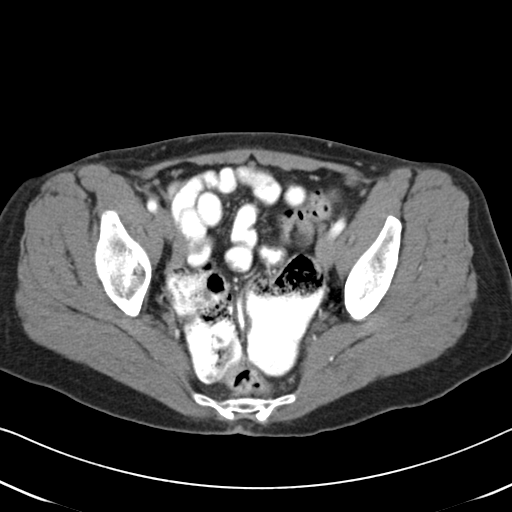
[im 30/84  soft-tissue]
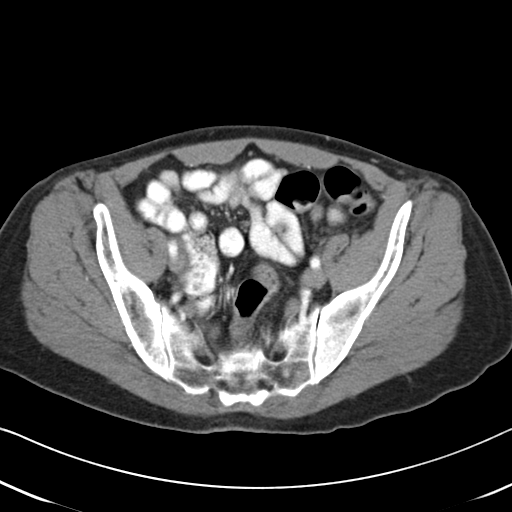
[im 38/84  soft-tissue]
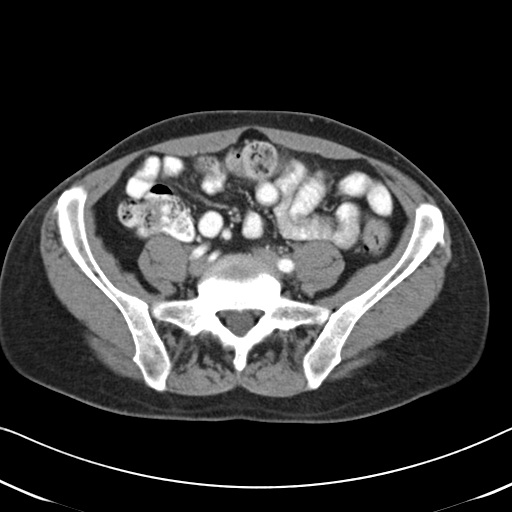
[im 42/84  soft-tissue]
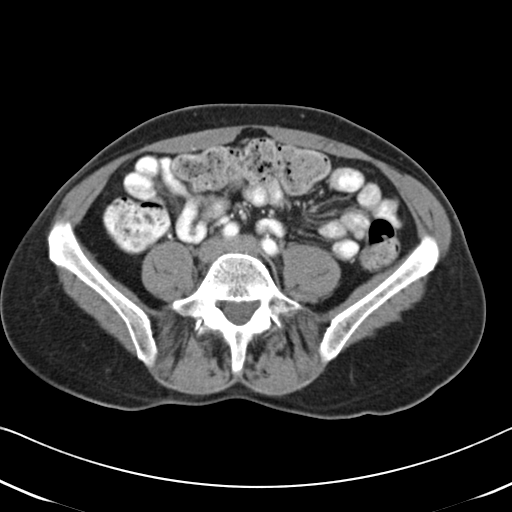
[im 46/84  soft-tissue]
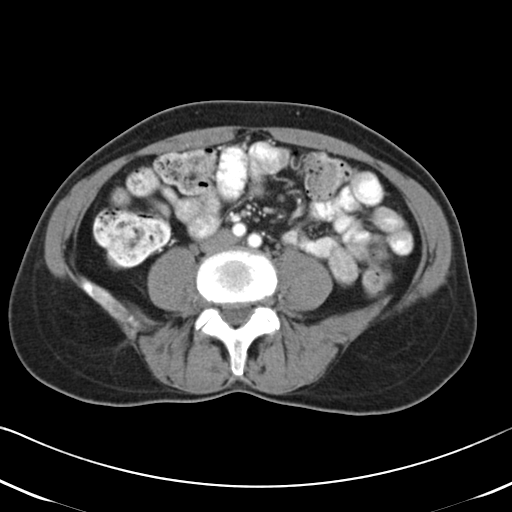
[im 54/84  soft-tissue]
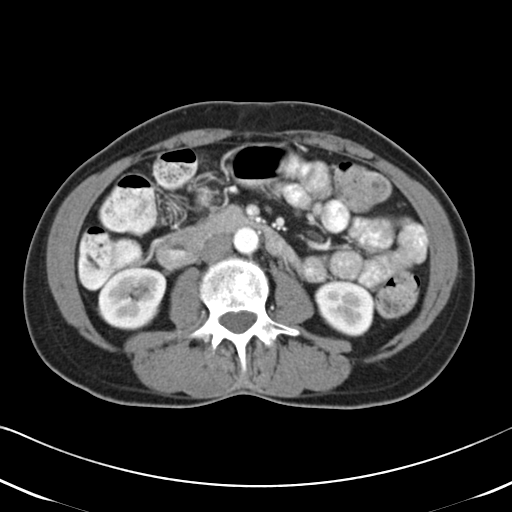
[im 54/84  bone]
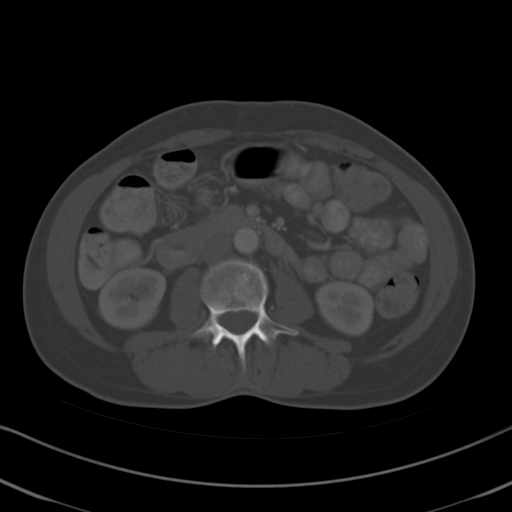
[im 59/84  soft-tissue]
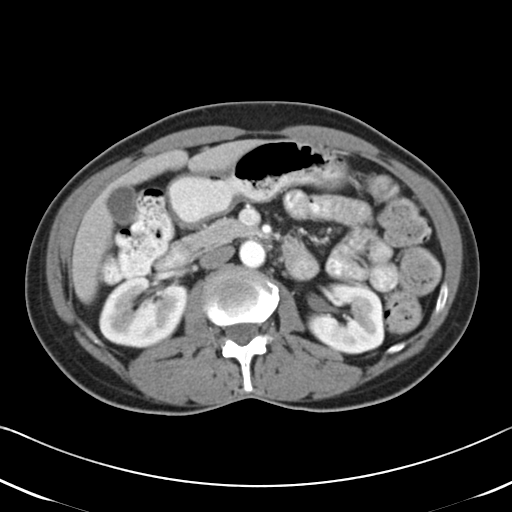
[im 67/84  soft-tissue]
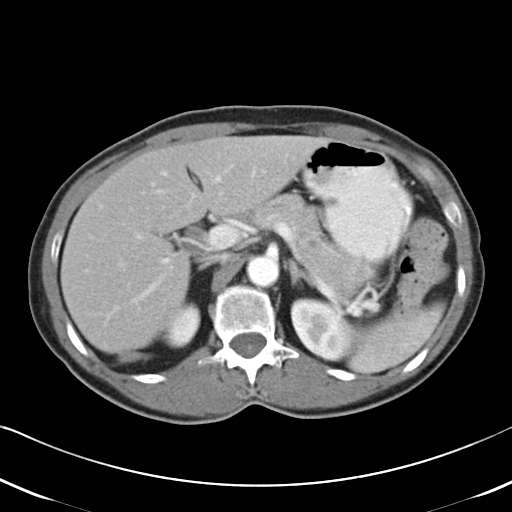
[im 71/84  soft-tissue]
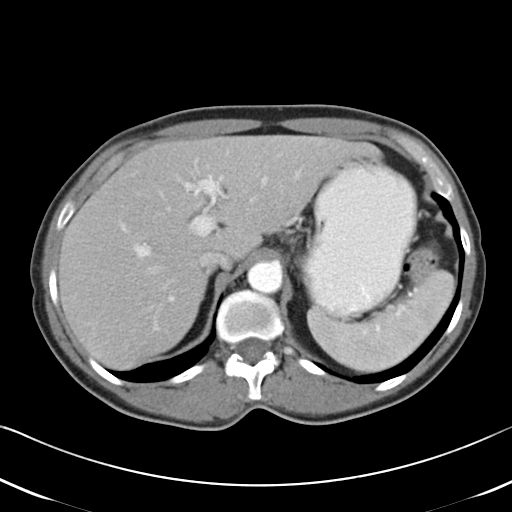
[im 79/84  soft-tissue]
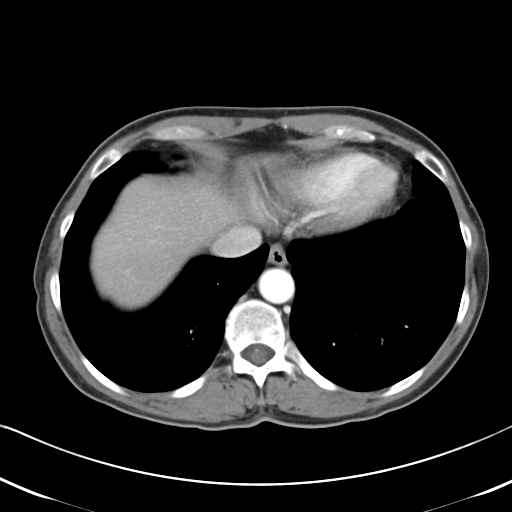

[Series 5: cor routine abd pel with · coronal · 0.62mm/px · 3 of 113 slices shown]
[im 38/113  soft-tissue]
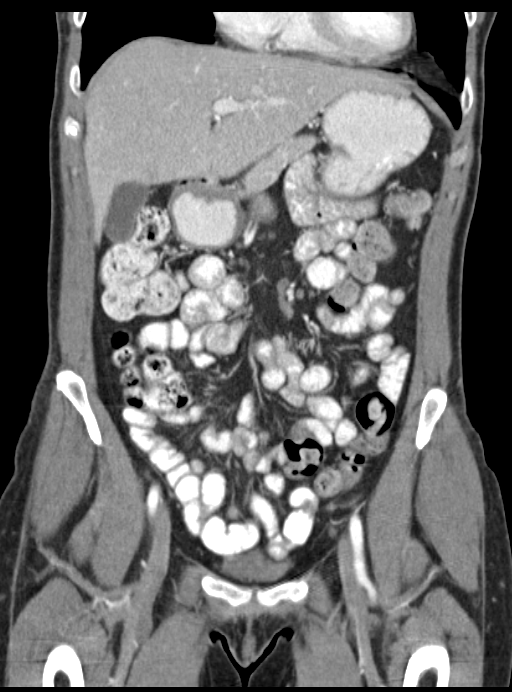
[im 50/113  soft-tissue]
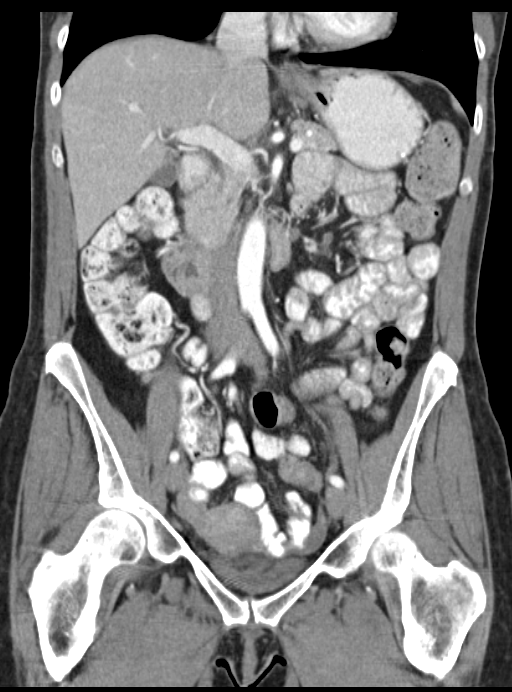
[im 63/113  soft-tissue]
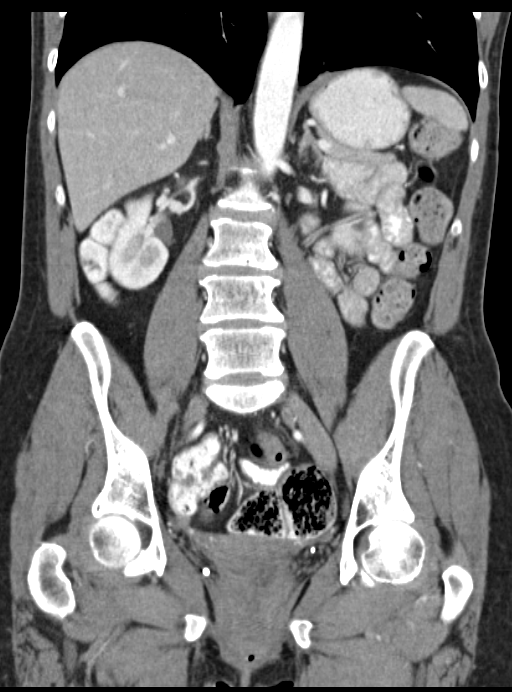

[16 of 46 positions shown; findings below may reference images not displayed]

FINDINGS: Lower chest:  Lung bases clear without infiltrate or effusion.

Hepatobiliary: The liver gallbladder and bile ducts are normal.

Pancreas: Negative

Spleen: Negative

Adrenals/Urinary Tract: 8 x 11 mm nonobstructing stone left mid
kidney. No calculi on the right. No renal mass or obstruction.
Urinary bladder is empty without focal abnormality.

Stomach/Bowel: Stomach is normal. No bowel obstruction. Negative for
bowel mass or edema. Prior appendectomy. Cecum crosses the midline
and is located low in the pelvis. Terminal ileum normal. No evidence
of diverticulitis.

Vascular/Lymphatic: Normal aorta. No atherosclerotic disease or
aneurysm. IVC normal. No adenopathy.

Reproductive: Normal uterus.  No pelvic mass lesion.

Other: No free fluid.  Negative for hernia.

Musculoskeletal: Mild lumbar disc degeneration. No acute skeletal
abnormality.
IMPRESSION: Appendectomy.

8 x 11 mm nonobstructing stone left kidney.

No acute abnormality.

## 2020-11-16 ENCOUNTER — Other Ambulatory Visit: Payer: Self-pay | Admitting: Physician Assistant

## 2020-11-16 DIAGNOSIS — Z1231 Encounter for screening mammogram for malignant neoplasm of breast: Secondary | ICD-10-CM

## 2021-09-05 ENCOUNTER — Emergency Department
Admission: EM | Admit: 2021-09-05 | Discharge: 2021-09-05 | Discharge status: Against Medical Advice | Payer: BC Managed Care – PPO | Attending: Emergency Medicine | Admitting: Emergency Medicine

## 2021-09-05 ENCOUNTER — Other Ambulatory Visit: Payer: Self-pay

## 2021-09-05 ENCOUNTER — Encounter: Payer: Self-pay | Admitting: Emergency Medicine

## 2021-09-05 DIAGNOSIS — R197 Diarrhea, unspecified: Secondary | ICD-10-CM | POA: Insufficient documentation

## 2021-09-05 DIAGNOSIS — Z5321 Procedure and treatment not carried out due to patient leaving prior to being seen by health care provider: Secondary | ICD-10-CM | POA: Insufficient documentation

## 2021-09-05 DIAGNOSIS — R110 Nausea: Secondary | ICD-10-CM | POA: Insufficient documentation

## 2021-09-05 LAB — CBC
HCT: 38.5 % (ref 36.0–46.0)
Hemoglobin: 12.8 g/dL (ref 12.0–15.0)
MCH: 28.8 pg (ref 26.0–34.0)
MCHC: 33.2 g/dL (ref 30.0–36.0)
MCV: 86.7 fL (ref 80.0–100.0)
Platelets: 221 10*3/uL (ref 150–400)
RBC: 4.44 MIL/uL (ref 3.87–5.11)
RDW: 12.5 % (ref 11.5–15.5)
WBC: 7.2 10*3/uL (ref 4.0–10.5)
nRBC: 0 % (ref 0.0–0.2)

## 2021-09-05 LAB — URINALYSIS, ROUTINE W REFLEX MICROSCOPIC
Bacteria, UA: NONE SEEN
Bilirubin Urine: NEGATIVE
Glucose, UA: NEGATIVE mg/dL
Ketones, ur: NEGATIVE mg/dL
Nitrite: NEGATIVE
Protein, ur: NEGATIVE mg/dL
Specific Gravity, Urine: 1.004 — ABNORMAL LOW (ref 1.005–1.030)
WBC, UA: NONE SEEN WBC/hpf (ref 0–5)
pH: 6 (ref 5.0–8.0)

## 2021-09-05 LAB — COMPREHENSIVE METABOLIC PANEL
ALT: 28 U/L (ref 0–44)
AST: 32 U/L (ref 15–41)
Albumin: 4.3 g/dL (ref 3.5–5.0)
Alkaline Phosphatase: 76 U/L (ref 38–126)
Anion gap: 8 (ref 5–15)
BUN: 13 mg/dL (ref 8–23)
CO2: 27 mmol/L (ref 22–32)
Calcium: 9.1 mg/dL (ref 8.9–10.3)
Chloride: 96 mmol/L — ABNORMAL LOW (ref 98–111)
Creatinine, Ser: 0.81 mg/dL (ref 0.44–1.00)
GFR, Estimated: >60 mL/min (ref >60)
Glucose, Bld: 117 mg/dL — ABNORMAL HIGH (ref 70–99)
Potassium: 3.5 mmol/L (ref 3.5–5.1)
Sodium: 131 mmol/L — ABNORMAL LOW (ref 135–145)
Total Bilirubin: 0.7 mg/dL (ref 0.3–1.2)
Total Protein: 7.4 g/dL (ref 6.5–8.1)

## 2021-09-05 LAB — LIPASE, BLOOD: Lipase: 42 U/L (ref 11–51)

## 2021-09-05 NOTE — ED Notes (Signed)
Pt is also reporting that she has had a few tick bites recently. Also she states that she has not had an appetite the past few days. Pt states that she has been forcing herself to eat. ?

## 2021-09-05 NOTE — ED Triage Notes (Signed)
Pt to ED from [facility] for diarrhea, nausea, and intestinal pain x 4 days. Pt denies vomiting, fevers, or chills. Pt is currently in NAD. Pt reports that she has had 4 or 5 episodes of diarrhea in the last 24 hours. ?

## 2021-11-21 ENCOUNTER — Other Ambulatory Visit: Payer: Self-pay | Admitting: Physician Assistant

## 2021-11-21 DIAGNOSIS — Z1231 Encounter for screening mammogram for malignant neoplasm of breast: Secondary | ICD-10-CM

## 2021-11-29 ENCOUNTER — Encounter: Payer: Self-pay | Admitting: *Deleted

## 2021-11-29 ENCOUNTER — Emergency Department
Admission: EM | Admit: 2021-11-29 | Discharge: 2021-11-30 | Disposition: A | Discharge status: Home / Self Care | Payer: BC Managed Care – PPO | Attending: Emergency Medicine | Admitting: Emergency Medicine

## 2021-11-29 ENCOUNTER — Emergency Department: Payer: BC Managed Care – PPO

## 2021-11-29 ENCOUNTER — Other Ambulatory Visit: Payer: Self-pay

## 2021-11-29 DIAGNOSIS — S0990XA Unspecified injury of head, initial encounter: Secondary | ICD-10-CM

## 2021-11-29 DIAGNOSIS — S060X0A Concussion without loss of consciousness, initial encounter: Secondary | ICD-10-CM | POA: Diagnosis not present

## 2021-11-29 DIAGNOSIS — Z23 Encounter for immunization: Secondary | ICD-10-CM | POA: Insufficient documentation

## 2021-11-29 DIAGNOSIS — S0101XA Laceration without foreign body of scalp, initial encounter: Secondary | ICD-10-CM

## 2021-11-29 DIAGNOSIS — W292XXA Contact with other powered household machinery, initial encounter: Secondary | ICD-10-CM | POA: Diagnosis not present

## 2021-11-29 MED ORDER — TETANUS-DIPHTH-ACELL PERTUSSIS 5-2.5-18.5 LF-MCG/0.5 IM SUSY
0.5000 mL | PREFILLED_SYRINGE | Freq: Once | INTRAMUSCULAR | Status: AC
  Administered 2021-11-29: 0.5 mL via INTRAMUSCULAR
  Filled 2021-11-29: qty 0.5

## 2021-11-29 NOTE — Discharge Instructions (Signed)
Please return to the ER for any severe headache, nausea/vomiting, vision changes or any urgent changes in your health.  He have symptoms of a very mild concussion.  Try to avoid bright lights, screen time, concentration and physical activity that increases your heart rate as these activities may exacerbate concussion symptoms.  Avoid driving for 24 hours.  Do not drink alcohol for 24 hours.  You may take Tylenol and/or ibuprofen as needed for pain.
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
## 2023-08-28 ENCOUNTER — Ambulatory Visit: Payer: Self-pay

## 2023-08-28 DIAGNOSIS — Z23 Encounter for immunization: Secondary | ICD-10-CM | POA: Diagnosis not present

## 2023-08-28 DIAGNOSIS — Z719 Counseling, unspecified: Secondary | ICD-10-CM

## 2023-08-28 NOTE — Progress Notes (Signed)
 Client seen in nurse clinic for MMR vaccine.  No history on file.  Vaccine administered and tolerated well.  Naimah Yingst Sherrilyn Rist, RN
